# Patient Record
Sex: Female | Born: 1960 | Race: Black or African American | Hispanic: No | Marital: Married | Smoking: Never smoker
Health system: Southern US, Community
[De-identification: ages and names within clinical notes are randomized; demographics above are authoritative.]

---

## 1998-02-04 ENCOUNTER — Ambulatory Visit (HOSPITAL_COMMUNITY): Admission: RE | Admit: 1998-02-04 | Discharge: 1998-02-04 | Payer: Self-pay | Admitting: Obstetrics and Gynecology

## 1998-07-13 ENCOUNTER — Inpatient Hospital Stay (HOSPITAL_COMMUNITY): Admission: AD | Admit: 1998-07-13 | Discharge: 1998-07-13 | Payer: Self-pay | Admitting: *Deleted

## 1998-08-24 ENCOUNTER — Encounter: Admission: RE | Admit: 1998-08-24 | Discharge: 1998-11-22 | Payer: Self-pay | Admitting: Obstetrics and Gynecology

## 1998-10-24 ENCOUNTER — Inpatient Hospital Stay (HOSPITAL_COMMUNITY): Admission: AD | Admit: 1998-10-24 | Discharge: 1998-10-26 | Payer: Self-pay | Admitting: Urology

## 1999-04-06 ENCOUNTER — Other Ambulatory Visit: Admission: RE | Admit: 1999-04-06 | Discharge: 1999-04-06 | Payer: Self-pay | Admitting: Obstetrics and Gynecology

## 2000-10-07 ENCOUNTER — Other Ambulatory Visit: Admission: RE | Admit: 2000-10-07 | Discharge: 2000-10-07 | Payer: Self-pay | Admitting: Obstetrics and Gynecology

## 2001-05-28 ENCOUNTER — Other Ambulatory Visit: Admission: RE | Admit: 2001-05-28 | Discharge: 2001-05-28 | Payer: Self-pay | Admitting: Obstetrics and Gynecology

## 2001-06-02 ENCOUNTER — Observation Stay (HOSPITAL_COMMUNITY): Admission: AD | Admit: 2001-06-02 | Discharge: 2001-06-02 | Payer: Self-pay

## 2001-07-16 ENCOUNTER — Inpatient Hospital Stay (HOSPITAL_COMMUNITY): Admission: AD | Admit: 2001-07-16 | Discharge: 2001-07-16 | Payer: Self-pay | Admitting: Obstetrics and Gynecology

## 2001-10-27 ENCOUNTER — Encounter: Admission: RE | Admit: 2001-10-27 | Discharge: 2001-10-27 | Payer: Self-pay | Admitting: Obstetrics and Gynecology

## 2001-12-05 ENCOUNTER — Encounter (HOSPITAL_COMMUNITY): Admission: RE | Admit: 2001-12-05 | Discharge: 2001-12-27 | Payer: Self-pay | Admitting: Obstetrics and Gynecology

## 2001-12-27 ENCOUNTER — Inpatient Hospital Stay (HOSPITAL_COMMUNITY): Admission: AD | Admit: 2001-12-27 | Discharge: 2001-12-29 | Payer: Self-pay | Admitting: Obstetrics and Gynecology

## 2001-12-30 ENCOUNTER — Encounter: Admission: RE | Admit: 2001-12-30 | Discharge: 2002-01-29 | Payer: Self-pay | Admitting: Obstetrics and Gynecology

## 2002-01-30 ENCOUNTER — Encounter: Admission: RE | Admit: 2002-01-30 | Discharge: 2002-03-01 | Payer: Self-pay | Admitting: Obstetrics and Gynecology

## 2002-02-02 ENCOUNTER — Other Ambulatory Visit: Admission: RE | Admit: 2002-02-02 | Discharge: 2002-02-02 | Payer: Self-pay | Admitting: Obstetrics and Gynecology

## 2002-02-05 ENCOUNTER — Ambulatory Visit (HOSPITAL_COMMUNITY): Admission: RE | Admit: 2002-02-05 | Discharge: 2002-02-05 | Payer: Self-pay | Admitting: Obstetrics and Gynecology

## 2002-03-02 ENCOUNTER — Encounter: Admission: RE | Admit: 2002-03-02 | Discharge: 2002-04-01 | Payer: Self-pay | Admitting: Obstetrics and Gynecology

## 2003-02-12 ENCOUNTER — Other Ambulatory Visit: Admission: RE | Admit: 2003-02-12 | Discharge: 2003-02-12 | Payer: Self-pay | Admitting: Obstetrics and Gynecology

## 2008-02-12 ENCOUNTER — Encounter: Admission: RE | Admit: 2008-02-12 | Discharge: 2008-02-12 | Payer: Self-pay | Admitting: Family Medicine

## 2008-02-12 ENCOUNTER — Encounter (INDEPENDENT_AMBULATORY_CARE_PROVIDER_SITE_OTHER): Payer: Self-pay | Admitting: Diagnostic Radiology

## 2008-02-26 ENCOUNTER — Encounter: Payer: Self-pay | Admitting: Endocrinology

## 2010-04-05 ENCOUNTER — Encounter: Admission: RE | Admit: 2010-04-05 | Discharge: 2010-04-05 | Payer: Self-pay | Admitting: Family Medicine

## 2010-04-26 ENCOUNTER — Encounter: Admission: RE | Admit: 2010-04-26 | Discharge: 2010-04-26 | Payer: Self-pay | Admitting: Family Medicine

## 2010-04-26 ENCOUNTER — Other Ambulatory Visit: Admission: RE | Admit: 2010-04-26 | Discharge: 2010-04-26 | Payer: Self-pay | Admitting: Interventional Radiology

## 2011-01-21 ENCOUNTER — Encounter: Payer: Self-pay | Admitting: Obstetrics and Gynecology

## 2011-01-21 ENCOUNTER — Encounter: Payer: Self-pay | Admitting: Family Medicine

## 2011-05-18 NOTE — H&P (Signed)
[facility]  Patient:    Taylor Villegas, Taylor Villegas Visit Number: [id number] MRN: [id number]          Service Type: OBS Location: [id number] Attending Physician:  Lenoard Aden Dictated by:   Lenoard Aden, M.D. Admit Date:  12/27/2001                           History and Physical  HISTORY OF PRESENT ILLNESS:   The patient is a 50 year old African-American female, G3, P2, EDD January 05, 2001, with class A2 diabetes mellitus and early labor.  ALLERGIES:                    No known drug allergies.  MEDICATIONS:                  Humulin, NPH insulin, and prenatal vitamins.  PAST OBSTETRIC HISTORY:       A 7 pound 4 ounce female in 1994, 7 pound 11 ounce female in 1999, history of preterm labor with both pregnancies.  PAST MEDICAL HISTORY/PAST SURGICAL HISTORY:             No other medical or surgical hospitalization.  FAMILY HISTORY:               Hypertension and diabetes.  PRENATAL LABORATORY DATA:     Pending.  PHYSICAL EXAMINATION:  GENERAL:                      She is a well-developed, well-nourished, African-American female in no apparent distress.  HEENT:                        Normal.  LUNGS:                        Clear.  HEART:                        Regular rate and rhythm.  ABDOMEN:                      Soft, gravid, nontender. Estimated fetal weight on ultrasound is 7 pounds 2 ounces.  PELVIC:                       Cervix 4 cm, 50%, vertex, -2.  EXTREMITIES:                  No cords.  NEUROLOGICAL:                 Nonfocal.  IMPRESSION:                   1. A 39-week intrauterine pregnancy.                               2. Class A2 diabetes mellitus.  PLAN:                         Proceed with augmentation and anticipated attempts at vaginal delivery. Watch blood glucoses every hour. Dictated by:   Lenoard Aden, M.D. Attending Physician:  Lenoard Aden DD:  12/27/01 TD:  12/28/01 Job:  [id number]

## 2011-05-18 NOTE — Op Note (Signed)
[facility]  Patient:    Taylor Villegas, Taylor Villegas Visit Number: [id number] MRN: [id number]          Service Type: DSU Location: [facility] Attending Physician:  Maxie Better Dictated by:   Sheria Lang. Cherly Hensen, M.D. Proc. Date: 02/05/02 Admit Date:  02/05/2002                             Operative Report  PREOPERATIVE DIAGNOSIS:       Desires sterilization.  POSTOPERATIVE DIAGNOSIS:      Desires sterilization and pelvic adhesions.  OPERATION:                    Examination under anesthesia, laparoscopic tubal ligation with bipolar cautery, lysis of adhesions.  SURGEON:                      Sheronette A. Cherly Hensen, M.D.  ANESTHESIA:                   General endotracheal anesthesia.  INDICATIONS:                  This is a 50 year old, gravida 3, para 3, divorced black female, status post vaginal delivery about five weeks ago who now presents for permanent sterilization.  Risks and benefits of the procedure as well as alternative birth control methods have been reviewed with the patient.  Failure rate of 1/500 to 1/600 has been quoted.  Permanence and nonreversibility of the procedure have been discussed.  Consent was signed and the patient was transferred to the operating room.  DESCRIPTION OF PROCEDURE:     Under adequate general anesthesia, the patient was placed in the dorsal lithotomy position.  She was sterilely prepped and draped in the usual fashion.  The bladder was catheterized for a moderate amount of urine.  Examination under anesthesia revealed an anteverted uterus. No adnexal masses could be appreciated.  Bivalve speculum was placed in the vagina. Single-tooth tenaculum was placed in the anterior lip of the cervix. A Kahn cannula was introduced into the cervical os and a tension tenaculum for manipulation of the uterus.  Attention was then turned to the abdomen where 3 cc of 0.25% Marcaine was injected infraumbilically and  suprapubically.  An infraumbilical incision was then made.  Veress needle was introduced.  Opening pressure _____ was noted and 3.5 liters of CO2 were insufflated.  Veress needle was removed.  A 10 mm trocar with sleeve was introduced into this abdominal cavity without incident.  A 5 mm port was introduced after suprapubic incision was made and this was done under direct visualization.  Inspection of the abdomen and pelvis was notable for normal liver edge, normal tubes and ovaries bilaterally.  An adhesion of the omentum to a subserosal posterior fibroid was noted fundally. No evidence of endometriosis in the anterior or posterior cul-de-sac.  Through the suprapubic port, bipolar cautery was then placed.  The fallopian tubes were cauterized in the mid portion.  The cauterization was at least about 1.5 cm of the tube bilaterally.  The adhesions was then also cauterized and was subsequently cut with scissors.  The base of both was inspected and the serosal surface from which the adhesions was present was cauterized.  Appendix appeared normal.  The abdomen was deflated after the _____ was removed.  The infraumbilical port was removed under direct visualization.  The fascia of the umbilical area  was closed with 0 Vicryl figure-of-eight suture and the skin was approximated using 4-0 Vicryl subcuticular stitch.  During the procedure, the tenaculum and acorn apparatus had been displaced and this was again reinserted sterilely.  This resulted in a small laceration on the anterior lip of the cervix and 3-0 chromic figure-of-eight sutures were placed for hemostasis.  The instruments from the vagina were then subsequently removed. Specimen none.  Complication was cervical laceration.  Blood loss was minimal. The patient tolerated the procedure well and was transferred to the recovery room in stable condition.  DESCRIPTION OF PROCEDURE: Dictated by:   Sheronette A. Cherly Hensen, M.D. Attending Physician:   Maxie Better DD:  02/05/02 TD:  02/06/02 Job: [id number]

## 2013-02-20 ENCOUNTER — Encounter (INDEPENDENT_AMBULATORY_CARE_PROVIDER_SITE_OTHER): Payer: BC Managed Care – PPO | Admitting: Radiology

## 2014-05-03 ENCOUNTER — Encounter: Payer: Self-pay | Admitting: Nurse Practitioner

## 2014-05-03 NOTE — Progress Notes (Signed)
This encounter was created in error - please disregard.

## 2014-08-26 ENCOUNTER — Encounter: Payer: Self-pay | Admitting: Adult Health

## 2014-08-26 NOTE — Progress Notes (Signed)
This encounter was created in error - please disregard.

## 2016-03-06 ENCOUNTER — Ambulatory Visit (INDEPENDENT_AMBULATORY_CARE_PROVIDER_SITE_OTHER): Payer: BLUE CROSS/BLUE SHIELD | Admitting: Family Medicine

## 2016-03-06 VITALS — BP 132/80 | HR 110 | Temp 98.4°F | Resp 16 | Ht 65.0 in | Wt 209.0 lb

## 2016-03-06 DIAGNOSIS — R509 Fever, unspecified: Secondary | ICD-10-CM | POA: Diagnosis not present

## 2016-03-06 DIAGNOSIS — J3489 Other specified disorders of nose and nasal sinuses: Secondary | ICD-10-CM | POA: Diagnosis not present

## 2016-03-06 LAB — POCT INFLUENZA A/B
Influenza A, POC: NEGATIVE
Influenza B, POC: NEGATIVE

## 2016-03-06 MED ORDER — AZITHROMYCIN 250 MG PO TABS: ORAL_TABLET | ORAL | Status: DC

## 2016-03-06 NOTE — Patient Instructions (Signed)
Upper Respiratory Infection, Adult Most upper respiratory infections (URIs) are a viral infection of the air passages leading to the lungs. A URI affects the nose, throat, and upper air passages. The most common type of URI is nasopharyngitis and is typically referred to as "the common cold." URIs run their course and usually go away on their own. Most of the time, a URI does not require medical attention, but sometimes a bacterial infection in the upper airways can follow a viral infection. This is called a secondary infection. Sinus and middle ear infections are common types of secondary upper respiratory infections. Bacterial pneumonia can also complicate a URI. A URI can worsen asthma and chronic obstructive pulmonary disease (COPD). Sometimes, these complications can require emergency medical care and may be life threatening.  CAUSES Almost all URIs are caused by viruses. A virus is a type of germ and can spread from one person to another.  RISKS FACTORS You may be at risk for a URI if:   You smoke.   You have chronic heart or lung disease.  You have a weakened defense (immune) system.   You are very young or very old.   You have nasal allergies or asthma.  You work in crowded or poorly ventilated areas.  You work in health care facilities or schools. SIGNS AND SYMPTOMS  Symptoms typically develop 2-3 days after you come in contact with a cold virus. Most viral URIs last 7-10 days. However, viral URIs from the influenza virus (flu virus) can last 14-18 days and are typically more severe. Symptoms may include:   Runny or stuffy (congested) nose.   Sneezing.   Cough.   Sore throat.   Headache.   Fatigue.   Fever.   Loss of appetite.   Pain in your forehead, behind your eyes, and over your cheekbones (sinus pain).  Muscle aches.  DIAGNOSIS  Your health care provider may diagnose a URI by:  Physical exam.  Tests to check that your symptoms are not due to  another condition such as:  Strep throat.  Sinusitis.  Pneumonia.  Asthma. TREATMENT  A URI goes away on its own with time. It cannot be cured with medicines, but medicines may be prescribed or recommended to relieve symptoms. Medicines may help:  Reduce your fever.  Reduce your cough.  Relieve nasal congestion. HOME CARE INSTRUCTIONS   Take medicines only as directed by your health care provider.   Gargle warm saltwater or take cough drops to comfort your throat as directed by your health care provider.  Use a warm mist humidifier or inhale steam from a shower to increase air moisture. This may make it easier to breathe.  Drink enough fluid to keep your urine clear or pale yellow.   Eat soups and other clear broths and maintain good nutrition.   Rest as needed.   Return to work when your temperature has returned to normal or as your health care provider advises. You may need to stay home longer to avoid infecting others. You can also use a face mask and careful hand washing to prevent spread of the virus.  Increase the usage of your inhaler if you have asthma.   Do not use any tobacco products, including cigarettes, chewing tobacco, or electronic cigarettes. If you need help quitting, ask your health care provider. PREVENTION  The best way to protect yourself from getting a cold is to practice good hygiene.   Avoid oral or hand contact with people with cold   symptoms.   Wash your hands often if contact occurs.  There is no clear evidence that vitamin C, vitamin E, echinacea, or exercise reduces the chance of developing a cold. However, it is always recommended to get plenty of rest, exercise, and practice good nutrition.  SEEK MEDICAL CARE IF:   You are getting worse rather than better.   Your symptoms are not controlled by medicine.   You have chills.  You have worsening shortness of breath.  You have brown or red mucus.  You have yellow or brown nasal  discharge.  You have pain in your face, especially when you bend forward.  You have a fever.  You have swollen neck glands.  You have pain while swallowing.  You have white areas in the back of your throat. SEEK IMMEDIATE MEDICAL CARE IF:   You have severe or persistent:  Headache.  Ear pain.  Sinus pain.  Chest pain.  You have chronic lung disease and any of the following:  Wheezing.  Prolonged cough.  Coughing up blood.  A change in your usual mucus.  You have a stiff neck.  You have changes in your:  Vision.  Hearing.  Thinking.  Mood. MAKE SURE YOU:   Understand these instructions.  Will watch your condition.  Will get help right away if you are not doing well or get worse.   This information is not intended to replace advice given to you by your health care provider. Make sure you discuss any questions you have with your health care provider.   Document Released: 06/12/2001 Document Revised: 05/03/2015 Document Reviewed: 03/24/2014 Elsevier Interactive Patient Education 2016 Elsevier Inc.  

## 2016-03-06 NOTE — Progress Notes (Signed)
Patient ID: Taylor Villegas, female   DOB: May 07, 1961, 55 y.o.   MRN: [id number]  By signing my name below, I, Essence Howell, attest that this documentation has been prepared under the direction and in the presence of Elvina Sidle, MD Electronically Signed: Charline Bills, ED Scribe 03/06/2016 at 11:39 AM.  Patient ID: Taylor Villegas MRN: [id number], DOB: December 30, 1961, 55 y.o. Date of Encounter: 03/06/2016, 11:15 AM  Primary Physician: No primary care provider on file.  Chief Complaint:  Chief Complaint  Patient presents with  . Chills    x 2 days  . Generalized Body Aches  . Nasal Congestion  . Cough  . Sore Throat  . cant keep food down  . OTHER    paperwork need to fill out    HPI: 55 y.o. year old female with history below presents with persistent postnasal drip for the past 3 days. Pt reports associated symptoms of chills, night sweats, sore throat, productive cough with sputum, generalized body aches, vomiting, HA. No treatments tried PTA. She denies fever, diarrhea. Pt reports sick contacts with similar symptoms.   Pt works at [employer].   History reviewed. No pertinent past medical history.   Home Meds: Prior to Admission medications   Not on File    Allergies: No Known Allergies  Social History   Social History  . Marital Status: Married    Spouse Name: N/A  . Number of Children: N/A  . Years of Education: N/A   Occupational History  . Not on file.   Social History Main Topics  . Smoking status: Never Smoker   . Smokeless tobacco: Never Used  . Alcohol Use: No  . Drug Use: No  . Sexual Activity: Not on file   Other Topics Concern  . Not on file   Social History Narrative    Review of Systems: Constitutional: negative for fever,  weight changes, or fatigue, +chills, +night sweats HEENT: negative for vision changes, hearing loss, congestion, epistaxis, or sinus pressure, +rhinorrhea, +ST, +postnasal drip Cardiovascular: negative for  chest pain or palpitations Respiratory: negative for hemoptysis, wheezing, shortness of breath, +cough Abdominal: negative for abdominal pain, nausea, -diarrhea, or constipation, +vomiting Msk: +myalgias Dermatological: negative for rash Neurologic: negative for dizziness, or syncope, +headache All other systems reviewed and are otherwise negative with the exception to those above and in the HPI.  Physical Exam: Blood pressure 132/80, pulse 110, temperature 98.4 F (36.9 C), temperature source Oral, resp. rate 16, height  (1.651 m), weight 209 lb (94.802 kg), SpO2 97 %., Body mass index is 34.78 kg/(m^2). General: Well developed, well nourished, in no acute distress. Head: Normocephalic, atraumatic, eyes without discharge, sclera non-icteric. Watery nasal discharge. Watery postnasal drip. Bilateral auditory canals clear, TM's are without perforation, pearly grey and translucent with reflective cone of light bilaterally. Oral cavity moist, posterior pharynx without exudate, erythema, peritonsillar abscess.  Neck: Supple. No thyromegaly. Full ROM. No lymphadenopathy. Lungs: Clear bilaterally to auscultation without wheezes, rales, or rhonchi. Breathing is unlabored. Heart: RRR with S1 S2. No murmurs, rubs, or gallops appreciated. Abdomen: Soft, non-tender, non-distended with normoactive bowel sounds. No hepatomegaly. No rebound/guarding. No obvious abdominal masses. Msk:  Strength and tone normal for age. Extremities/Skin: Warm and dry. No clubbing or cyanosis. No edema. No rashes or suspicious lesions. Neuro: Alert and oriented X 3. Moves all extremities spontaneously. Gait is normal. CNII-XII grossly in tact. Psych:  Responds to questions appropriately with a normal affect.   Labs: Results for orders  placed or performed in visit on 03/06/16  POCT Influenza A/B  Result Value Ref Range   Influenza A, POC Negative Negative   Influenza B, POC Negative Negative    ASSESSMENT AND PLAN:    55 y.o. year old female with  1. Nasal discharge   2. Fever, unspecified fever cause    This chart was scribed in my presence and reviewed by me personally.    ICD-9-CM ICD-10-CM   1. Nasal discharge 478.19 J34.89 POCT Influenza A/B     azithromycin (ZITHROMAX) 250 MG tablet  2. Fever, unspecified fever cause 780.60 R50.9 POCT Influenza A/B     azithromycin (ZITHROMAX) 250 MG tablet     Signed, Elvina SidleKurt Peytan Andringa, MD 03/06/2016 11:15 AM

## 2016-03-20 ENCOUNTER — Other Ambulatory Visit: Payer: Self-pay | Admitting: Family Medicine

## 2016-03-20 ENCOUNTER — Telehealth: Payer: Self-pay

## 2016-03-20 MED ORDER — AMOXICILLIN-POT CLAVULANATE 875-125 MG PO TABS: 1.0000 | ORAL_TABLET | Freq: Two times a day (BID) | ORAL | Status: DC

## 2016-03-20 NOTE — Telephone Encounter (Signed)
Pt was seen in office two weeks ago and is now complaining that the antibiotic she was prescribed did not work. She states that she's been trying to call our office all last week and no one answered. I explained to patient that she would have to come back into the office to be seen to be prescribed another antibiotic. Pt is very upset and said she can't come into the office. Please give patient a call.

## 2016-03-21 NOTE — Progress Notes (Signed)
Pt advised Rx sent in 

## 2016-06-11 ENCOUNTER — Ambulatory Visit (INDEPENDENT_AMBULATORY_CARE_PROVIDER_SITE_OTHER): Payer: BLUE CROSS/BLUE SHIELD | Admitting: Family Medicine

## 2016-06-11 VITALS — BP 130/86 | HR 97 | Temp 98.0°F | Resp 18 | Ht 65.0 in | Wt 209.0 lb

## 2016-06-11 DIAGNOSIS — R635 Abnormal weight gain: Secondary | ICD-10-CM

## 2016-06-11 DIAGNOSIS — F4323 Adjustment disorder with mixed anxiety and depressed mood: Secondary | ICD-10-CM | POA: Diagnosis not present

## 2016-06-11 DIAGNOSIS — Z658 Other specified problems related to psychosocial circumstances: Secondary | ICD-10-CM | POA: Diagnosis not present

## 2016-06-11 DIAGNOSIS — R400 Somnolence: Secondary | ICD-10-CM

## 2016-06-11 DIAGNOSIS — R519 Headache, unspecified: Secondary | ICD-10-CM

## 2016-06-11 DIAGNOSIS — E041 Nontoxic single thyroid nodule: Secondary | ICD-10-CM | POA: Diagnosis not present

## 2016-06-11 DIAGNOSIS — F439 Reaction to severe stress, unspecified: Secondary | ICD-10-CM

## 2016-06-11 DIAGNOSIS — N951 Menopausal and female climacteric states: Secondary | ICD-10-CM | POA: Diagnosis not present

## 2016-06-11 DIAGNOSIS — R51 Headache: Secondary | ICD-10-CM

## 2016-06-11 DIAGNOSIS — R0683 Snoring: Secondary | ICD-10-CM | POA: Diagnosis not present

## 2016-06-11 DIAGNOSIS — G4710 Hypersomnia, unspecified: Secondary | ICD-10-CM | POA: Diagnosis not present

## 2016-06-11 NOTE — Patient Instructions (Signed)
IF you received an x-ray today, you will receive an invoice from [facility]. Please contact [facility] at [phone] with questions or concerns regarding your invoice.   IF you received labwork today, you will receive an invoice from [employer]. Please contact [facility] at [phone] with questions or concerns regarding your invoice.   Our billing staff will not be able to assist you with questions regarding bills from these companies.  You will be contacted with the lab results as soon as they are available. The fastest way to get your results is to activate your My Chart account. Instructions are located on the last page of this paperwork. If you have not heard from [place] regarding the results in 2 weeks, please contact this office.     Headaches may be related to tension, stress, decreased quality sleep, or a combination of all. They may be migraine headaches, and can have you evaluated by a neurologist or headache specialist whenever he would like. In the meantime, Tylenol as needed, and keep a diary of when you are having a headache and any associated symptoms.  Your thyroid does appear to be enlarged with a possible nodule on the right. I will schedule an ultrasound of your thyroid as well as check thyroid test. Once we have the results, can discuss further workup if needed.  Your flushes are likely due to menopause, but as we discussed, thyroid can also be a cause.  I will check some blood tests for this as well, and can follow-up to discuss other treatments in the next few weeks. See handout for treatment options over the counter. I would also encourage you to schedule appointment with Dr. Garwin Brothers to discuss other options for menopausal symptoms.  See information on stress and stress management below. If you are feeling more depression symptoms, or would like to discuss medication options, return to discuss further here or other medical  provider. Return to the clinic or go to the nearest emergency room if any of your symptoms worsen or new symptoms occur.  I will refer you for a sleep study.   For weight, no new medications recommended for now until we have more information on above. If you would like to meet with a weight management specialist, Dr. Valetta Close at [place] may be able to advise you further: [phone]  Return to the clinic or go to the nearest emergency room if any of your symptoms worsen or new symptoms occur.  Stress and Stress Management Stress is a normal reaction to life events. It is what you feel when life demands more than you are used to or more than you can handle. Some stress can be useful. For example, the stress reaction can help you catch the last bus of the day, study for a test, or meet a deadline at work. But stress that occurs too often or for too long can cause problems. It can affect your emotional health and interfere with relationships and normal daily activities. Too much stress can weaken your immune system and increase your risk for physical illness. If you already have a medical problem, stress can make it worse. CAUSES  All sorts of life events may cause stress. An event that causes stress for one person may not be stressful for another person. Major life events commonly cause stress. These may be positive or negative. Examples include losing your job, moving into a new home, getting married, having a baby, or losing  a loved one. Less obvious life events may also cause stress, especially if they occur day after day or in combination. Examples include working long hours, driving in traffic, caring for children, being in debt, or being in a difficult relationship. SIGNS AND SYMPTOMS Stress may cause emotional symptoms including, the following:  Anxiety. This is feeling worried, afraid, on edge, overwhelmed, or out of control.  Anger. This is feeling irritated or  impatient.  Depression. This is feeling sad, down, helpless, or guilty.  Difficulty focusing, remembering, or making decisions. Stress may cause physical symptoms, including the following:   Aches and pains. These may affect your head, neck, back, stomach, or other areas of your body.  Tight muscles or clenched jaw.  Low energy or trouble sleeping. Stress may cause unhealthy behaviors, including the following:   Eating to feel better (overeating) or skipping meals.  Sleeping too little, too much, or both.  Working too much or putting off tasks (procrastination).  Smoking, drinking alcohol, or using drugs to feel better. DIAGNOSIS  Stress is diagnosed through an assessment by your health care provider. Your health care provider will ask questions about your symptoms and any stressful life events.Your health care provider will also ask about your medical history and may order blood tests or other tests. Certain medical conditions and medicine can cause physical symptoms similar to stress. Mental illness can cause emotional symptoms and unhealthy behaviors similar to stress. Your health care provider may refer you to a mental health professional for further evaluation.  TREATMENT  Stress management is the recommended treatment for stress.The goals of stress management are reducing stressful life events and coping with stress in healthy ways.  Techniques for reducing stressful life events include the following:  Stress identification. Self-monitor for stress and identify what causes stress for you. These skills may help you to avoid some stressful events.  Time management. Set your priorities, keep a calendar of events, and learn to say "no." These tools can help you avoid making too many commitments. Techniques for coping with stress include the following:  Rethinking the problem. Try to think realistically about stressful events rather than ignoring them or overreacting. Try to find  the positives in a stressful situation rather than focusing on the negatives.  Exercise. Physical exercise can release both physical and emotional tension. The key is to find a form of exercise you enjoy and do it regularly.  Relaxation techniques. These relax the body and mind. Examples include yoga, meditation, tai chi, biofeedback, deep breathing, progressive muscle relaxation, listening to music, being out in nature, journaling, and other hobbies. Again, the key is to find one or more that you enjoy and can do regularly.  Healthy lifestyle. Eat a balanced diet, get plenty of sleep, and do not smoke. Avoid using alcohol or drugs to relax.  Strong support network. Spend time with family, friends, or other people you enjoy being around.Express your feelings and talk things over with someone you trust. Counseling or talktherapy with a mental health professional may be helpful if you are having difficulty managing stress on your own. Medicine is typically not recommended for the treatment of stress.Talk to your health care provider if you think you need medicine for symptoms of stress. HOME CARE INSTRUCTIONS  Keep all follow-up visits as directed by your health care provider.  Take all medicines as directed by your health care provider. SEEK MEDICAL CARE IF:  Your symptoms get worse or you start having new symptoms.  You feel  overwhelmed by your problems and can no longer manage them on your own. SEEK IMMEDIATE MEDICAL CARE IF:  You feel like hurting yourself or someone else.   This information is not intended to replace advice given to you by your health care provider. Make sure you discuss any questions you have with your health care provider.   Document Released: 06/12/2001 Document Revised: 01/07/2015 Document Reviewed: 08/11/2013 Elsevier Interactive Patient Education [employer].

## 2016-06-11 NOTE — Progress Notes (Signed)
Subjective:  By signing my name below, I, Raven Small, attest that this documentation has been prepared under the direction and in the presence of Merri Ray, MD.  Electronically Signed: Thea Alken, ED Scribe. 06/11/2016. 5:12 PM.   Patient ID: Taylor Villegas, female    DOB: 10/15/1961, 55 y.o.   MRN: [id number]  HPI Chief Complaint  Patient presents with  . Migraine  . Night Sweats    ALL DAY  . Obesity    HPI Comments: Taylor Villegas is a 55 y.o. female who presents to the [facility] complaining of migraines, weight gain and night sweats. She is new pt to me, last seen by Dr. Joseph Art in March for a acute illness. LMP-1/1. Prior to this,  menses was irregular coming every other month or 2. Pt reports since menses ended she's gained a lot of weight, which has made her self consciences. She states she's gained 20lbs since September . Pt tries to work out 30 mins - hour a week. She does not indulge in fast foods, sugar containing beverages or desserts. Gaining weight has made her feel anxious. She has been treated for anxiety and depression several years ago. She has tried therapy in the past but has felt ths was a waste of time. She has tried a Physiological scientist, nutritionist, diets, B12 injection and appetite suppressants in the past as well but this did not work either. In addition to this, she reports hot flashes, night sweats 3-4 times a day as well as at night and fatigue, falling asleep during the day, since onset of weight gain in September. Also as a result of weight gain she reports snoring, which she has never done. Pt reports several years ago she was diagnosed with hyperthyroidism, but was not started on medication. Pt has not had this tested since. Pt shows interest in hormone replacement.   Wt Readings from Last 3 Encounters:  06/11/16 209 lb (94.802 kg)  03/06/16 209 lb (94.802 kg)  08/26/14 204 lb (92.534 kg)   She also complains of  episodic migraines for the past 4 weeks with associated nausea. Last headache she had lasted for 3 days. Pt reports family stress, stating that her father is in his last stage of parkinsons. Pt's youngest brother is the caregiver of their father and feels most comfortable talking to him, but states they talk very little of stress and emotion. Pt also states her husband wants a divorce. Her son is currently undergoing counseling for this as well as marijuana abuse. States her son was terminated from work due to falling asleep.   There are no active problems to display for this patient.  History reviewed. No pertinent past medical history. History reviewed. No pertinent past surgical history. No Known Allergies Prior to Admission medications   Medication Sig Start Date End Date Taking? Authorizing Provider  amoxicillin-clavulanate (AUGMENTIN) 875-125 MG tablet Take 1 tablet by mouth 2 (two) times daily. Patient not taking: Reported on 06/11/2016 03/20/16   Robyn Haber, MD  azithromycin (ZITHROMAX) 250 MG tablet Take 2 tabs PO x 1 dose, then 1 tab PO QD x 4 days Patient not taking: Reported on 06/11/2016 03/06/16   Robyn Haber, MD   Social History   Social History  . Marital Status: Married    Spouse Name: N/A  . Number of Children: N/A  . Years of Education: N/A   Occupational History  . Not on file.   Social History Main Topics  .  Smoking status: Never Smoker   . Smokeless tobacco: Never Used  . Alcohol Use: No  . Drug Use: No  . Sexual Activity: Not on file   Other Topics Concern  . Not on file   Social History Narrative   Review of Systems  Constitutional: Positive for fatigue and unexpected weight change.  Gastrointestinal: Positive for nausea. Negative for vomiting.  Neurological: Positive for headaches.  Psychiatric/Behavioral: Negative for suicidal ideas and self-injury. The patient is nervous/anxious.    Objective:   Physical Exam  Constitutional: She is oriented  to person, place, and time. She appears well-developed and well-nourished. No distress.  HENT:  Head: Normocephalic and atraumatic.  Eyes: Conjunctivae and EOM are normal.  Neck: Neck supple.  Prominent thyroid with slight nodularity on the right side, but is non tender.   Cardiovascular: Normal rate, regular rhythm and normal heart sounds.  Exam reveals no gallop and no friction rub.   No murmur heard. Pulmonary/Chest: Effort normal.  Musculoskeletal: Normal range of motion.  Neurological: She is alert and oriented to person, place, and time.  Skin: Skin is warm and dry.  Psychiatric: She has a normal mood and affect. Her behavior is normal.  Nursing note and vitals reviewed.  Filed Vitals:   06/11/16 1627  BP: 130/86  Pulse: 97  Temp: 98 F (36.7 C)  TempSrc: Oral  Resp: 18  Height: _0  (1.651 m)  Weight: 209 lb (94.802 kg)  SpO2: 99%   Assessment & Plan:  Taylor Villegas is a 55 y.o. female Hot flushes, perimenopausal - Plan: TSH, FSH/LH, CBC, T3, Free, T4, Free, US Soft Tissue Head/Neck  - Suspected perimenopausal. Check TSH, FSH, LH. Handout from up-to-date on treatment options, can also follow-up with her OB/GYN.   Nonintractable headache, unspecified chronicity pattern, unspecified headache type  - Episodic. Multifactorial likely with tension/stress headache. Consider headache wellness Center evaluation if persistent. Tylenol for now.  Adjustment disorder with mixed anxiety and depressed mood Situational stress  - Suspect possible underlying anxiety/depression with situational worsening with her menopausal symptoms, as well as family stressors as above. Declined counseling, and declined medications as this did not help in the past.  Denies suicidal ideation. Stress management techniques discussed on handout.  Weight gain - Plan: TSH, FSH/LH  -minimal change from our readings here. Check labs above, and number given for bariatric specialist. No new meds  recommended for now.   Snoring - Plan: Ambulatory referral to Sleep Studies Daytime somnolence - Plan: Ambulatory referral to Sleep Studies  -refer for sleep study/eval.   Thyroid nodule - Plan: TSH, T3, Free, T4, Free, US Soft Tissue Head/Neck  -hx of hyperthyroid by hx, possible nodule on right side. Check labs, ultrasound. Plan on follow up in 2 weeks.   No orders of the defined types were placed in this encounter.   Patient Instructions       IF you received an x-ray today, you will receive an invoice from [facility]. Please contact [facility] at [phone] with questions or concerns regarding your invoice.   IF you received labwork today, you will receive an invoice from [employer]. Please contact [facility] at [phone] with questions or concerns regarding your invoice.   Our billing staff will not be able to assist you with questions regarding bills from these companies.  You will be contacted with the lab results as soon as they are available. The fastest way to get your results is to activate your My Chart  account. Instructions are located on the last page of this paperwork. If you have not heard from [place] regarding the results in 2 weeks, please contact this office.     Headaches may be related to tension, stress, decreased quality sleep, or a combination of all. They may be migraine headaches, and can have you evaluated by a neurologist or headache specialist whenever he would like. In the meantime, Tylenol as needed, and keep a diary of when you are having a headache and any associated symptoms.  Your thyroid does appear to be enlarged with a possible nodule on the right. I will schedule an ultrasound of your thyroid as well as check thyroid test. Once we have the results, can discuss further workup if needed.  Your flushes are likely due to menopause, but as we discussed, thyroid can also be a cause.  I will check some blood  tests for this as well, and can follow-up to discuss other treatments in the next few weeks. See handout for treatment options over the counter. I would also encourage you to schedule appointment with Dr. Garwin Brothers to discuss other options for menopausal symptoms.  See information on stress and stress management below. If you are feeling more depression symptoms, or would like to discuss medication options, return to discuss further here or other medical provider. Return to the clinic or go to the nearest emergency room if any of your symptoms worsen or new symptoms occur.  I will refer you for a sleep study.   For weight, no new medications recommended for now until we have more information on above. If you would like to meet with a weight management specialist, Dr. Valetta Close at [facility] may be able to advise you further: [phone]  Return to the clinic or go to the nearest emergency room if any of your symptoms worsen or new symptoms occur.  Stress and Stress Management Stress is a normal reaction to life events. It is what you feel when life demands more than you are used to or more than you can handle. Some stress can be useful. For example, the stress reaction can help you catch the last bus of the day, study for a test, or meet a deadline at work. But stress that occurs too often or for too long can cause problems. It can affect your emotional health and interfere with relationships and normal daily activities. Too much stress can weaken your immune system and increase your risk for physical illness. If you already have a medical problem, stress can make it worse. CAUSES  All sorts of life events may cause stress. An event that causes stress for one person may not be stressful for another person. Major life events commonly cause stress. These may be positive or negative. Examples include losing your job, moving into a new home, getting married, having a baby, or losing a loved  one. Less obvious life events may also cause stress, especially if they occur day after day or in combination. Examples include working long hours, driving in traffic, caring for children, being in debt, or being in a difficult relationship. SIGNS AND SYMPTOMS Stress may cause emotional symptoms including, the following:  Anxiety. This is feeling worried, afraid, on edge, overwhelmed, or out of control.  Anger. This is feeling irritated or impatient.  Depression. This is feeling sad, down, helpless, or guilty.  Difficulty focusing, remembering, or making decisions. Stress may cause physical symptoms, including the following:   Aches and pains. These  may affect your head, neck, back, stomach, or other areas of your body.  Tight muscles or clenched jaw.  Low energy or trouble sleeping. Stress may cause unhealthy behaviors, including the following:   Eating to feel better (overeating) or skipping meals.  Sleeping too little, too much, or both.  Working too much or putting off tasks (procrastination).  Smoking, drinking alcohol, or using drugs to feel better. DIAGNOSIS  Stress is diagnosed through an assessment by your health care provider. Your health care provider will ask questions about your symptoms and any stressful life events.Your health care provider will also ask about your medical history and may order blood tests or other tests. Certain medical conditions and medicine can cause physical symptoms similar to stress. Mental illness can cause emotional symptoms and unhealthy behaviors similar to stress. Your health care provider may refer you to a mental health professional for further evaluation.  TREATMENT  Stress management is the recommended treatment for stress.The goals of stress management are reducing stressful life events and coping with stress in healthy ways.  Techniques for reducing stressful life events include the following:  Stress identification. Self-monitor  for stress and identify what causes stress for you. These skills may help you to avoid some stressful events.  Time management. Set your priorities, keep a calendar of events, and learn to say "no." These tools can help you avoid making too many commitments. Techniques for coping with stress include the following:  Rethinking the problem. Try to think realistically about stressful events rather than ignoring them or overreacting. Try to find the positives in a stressful situation rather than focusing on the negatives.  Exercise. Physical exercise can release both physical and emotional tension. The key is to find a form of exercise you enjoy and do it regularly.  Relaxation techniques. These relax the body and mind. Examples include yoga, meditation, tai chi, biofeedback, deep breathing, progressive muscle relaxation, listening to music, being out in nature, journaling, and other hobbies. Again, the key is to find one or more that you enjoy and can do regularly.  Healthy lifestyle. Eat a balanced diet, get plenty of sleep, and do not smoke. Avoid using alcohol or drugs to relax.  Strong support network. Spend time with family, friends, or other people you enjoy being around.Express your feelings and talk things over with someone you trust. Counseling or talktherapy with a mental health professional may be helpful if you are having difficulty managing stress on your own. Medicine is typically not recommended for the treatment of stress.Talk to your health care provider if you think you need medicine for symptoms of stress. HOME CARE INSTRUCTIONS  Keep all follow-up visits as directed by your health care provider.  Take all medicines as directed by your health care provider. SEEK MEDICAL CARE IF:  Your symptoms get worse or you start having new symptoms.  You feel overwhelmed by your problems and can no longer manage them on your own. SEEK IMMEDIATE MEDICAL CARE IF:  You feel like hurting  yourself or someone else.   This information is not intended to replace advice given to you by your health care provider. Make sure you discuss any questions you have with your health care provider.   Document Released: 06/12/2001 Document Revised: 01/07/2015 Document Reviewed: 08/11/2013 Elsevier Interactive Patient Education [employer].

## 2016-06-12 LAB — FSH/LH
FSH: 69.6 m[IU]/mL
LH: 47.8 m[IU]/mL

## 2016-06-12 LAB — T4, FREE: FREE T4: 1.3 ng/dL (ref 0.8–1.8)

## 2016-06-12 LAB — CBC
HEMATOCRIT: 40.4 % (ref 35.0–45.0)
HEMOGLOBIN: 13.6 g/dL (ref 11.7–15.5)
MCH: 26.4 pg — ABNORMAL LOW (ref 27.0–33.0)
MCHC: 33.7 g/dL (ref 32.0–36.0)
MCV: 78.4 fL — ABNORMAL LOW (ref 80.0–100.0)
MPV: 10.1 fL (ref 7.5–12.5)
Platelets: 389 10*3/uL (ref 140–400)
RBC: 5.15 MIL/uL — AB (ref 3.80–5.10)
RDW: 15.7 % — ABNORMAL HIGH (ref 11.0–15.0)
WBC: 8.2 10*3/uL (ref 3.8–10.8)

## 2016-06-12 LAB — TSH: TSH: 0.56 mIU/L

## 2016-06-12 LAB — T3, FREE: T3 FREE: 3.3 pg/mL (ref 2.3–4.2)

## 2016-06-13 ENCOUNTER — Encounter: Payer: Self-pay | Admitting: Family Medicine

## 2016-06-19 ENCOUNTER — Institutional Professional Consult (permissible substitution): Payer: BLUE CROSS/BLUE SHIELD | Admitting: Neurology

## 2016-06-20 ENCOUNTER — Other Ambulatory Visit: Payer: Self-pay

## 2016-06-26 ENCOUNTER — Telehealth: Payer: Self-pay | Admitting: Emergency Medicine

## 2016-06-26 NOTE — Telephone Encounter (Signed)
-----   Message from Taylor FloodJeffrey R Greene, MD sent at 06/24/2016  3:34 PM EDT ----- Call patient. Thyroid testing was normal, FSH/LH hormones are in postmenopausal range. Overall blood counts look okay. Let me know if there are any questions.  Dr. Neva SeatGreene

## 2016-06-26 NOTE — Telephone Encounter (Signed)
Attempted to reach pt with results Both numbers listed VM full and not valid

## 2016-07-02 ENCOUNTER — Other Ambulatory Visit: Payer: Self-pay

## 2016-07-11 ENCOUNTER — Institutional Professional Consult (permissible substitution): Payer: BLUE CROSS/BLUE SHIELD | Admitting: Neurology

## 2016-07-24 ENCOUNTER — Institutional Professional Consult (permissible substitution): Payer: BLUE CROSS/BLUE SHIELD | Admitting: Neurology

## 2020-02-17 ENCOUNTER — Encounter: Payer: Self-pay | Admitting: Adult Health Nurse Practitioner

## 2020-02-17 ENCOUNTER — Ambulatory Visit: Payer: 59 | Admitting: Adult Health Nurse Practitioner

## 2020-02-17 ENCOUNTER — Other Ambulatory Visit: Payer: Self-pay

## 2020-02-17 ENCOUNTER — Other Ambulatory Visit (HOSPITAL_COMMUNITY)
Admission: RE | Admit: 2020-02-17 | Discharge: 2020-02-17 | Disposition: A | Discharge status: Home / Self Care | Payer: 59 | Source: Ambulatory Visit | Attending: Adult Health Nurse Practitioner | Admitting: Adult Health Nurse Practitioner

## 2020-02-17 VITALS — BP 179/83 | HR 99 | Temp 98.2°F | Ht 64.5 in | Wt 215.4 lb

## 2020-02-17 DIAGNOSIS — Z113 Encounter for screening for infections with a predominantly sexual mode of transmission: Secondary | ICD-10-CM | POA: Insufficient documentation

## 2020-02-17 DIAGNOSIS — N3000 Acute cystitis without hematuria: Secondary | ICD-10-CM | POA: Diagnosis not present

## 2020-02-17 DIAGNOSIS — R350 Frequency of micturition: Secondary | ICD-10-CM | POA: Diagnosis not present

## 2020-02-17 DIAGNOSIS — N390 Urinary tract infection, site not specified: Secondary | ICD-10-CM

## 2020-02-17 HISTORY — DX: Urinary tract infection, site not specified: N39.0

## 2020-02-17 LAB — POCT URINALYSIS DIP (MANUAL ENTRY)
Bilirubin, UA: NEGATIVE
Glucose, UA: NEGATIVE mg/dL
Ketones, POC UA: NEGATIVE mg/dL
Nitrite, UA: NEGATIVE
Spec Grav, UA: >=1.03 — AB (ref 1.010–1.025)
Urobilinogen, UA: 0.2 E.U./dL
pH, UA: 6 (ref 5.0–8.0)

## 2020-02-17 MED ORDER — NITROFURANTOIN MONOHYD MACRO 100 MG PO CAPS: 100.0000 mg | ORAL_CAPSULE | Freq: Two times a day (BID) | ORAL | 0 refills | Status: AC

## 2020-02-17 NOTE — Patient Instructions (Signed)
° ° ° °  If you have lab work done today you will be contacted with your lab results within the next 2 weeks.  If you have not heard from us then please contact us. The fastest way to get your results is to register for My Chart. ° ° °IF you received an x-ray today, you will receive an invoice from [facility]. Please contact [facility] at [phone] with questions or concerns regarding your invoice.  ° °IF you received labwork today, you will receive an invoice from [facility]. Please contact [facility] at [phone] with questions or concerns regarding your invoice.  ° °Our billing staff will not be able to assist you with questions regarding bills from these companies. ° °You will be contacted with the lab results as soon as they are available. The fastest way to get your results is to activate your My Chart account. Instructions are located on the last page of this paperwork. If you have not heard from us regarding the results in 2 weeks, please contact this office. °  ° ° ° °

## 2020-02-18 LAB — GC/CHLAMYDIA PROBE AMP (~~LOC~~) NOT AT ARMC
Chlamydia: NEGATIVE
Comment: NEGATIVE
Comment: NORMAL
Neisseria Gonorrhea: NEGATIVE

## 2020-02-18 LAB — RPR: RPR Ser Ql: NONREACTIVE

## 2020-02-18 LAB — HIV ANTIBODY (ROUTINE TESTING W REFLEX): HIV Screen 4th Generation wRfx: NONREACTIVE

## 2020-02-19 LAB — URINE CULTURE

## 2020-02-23 ENCOUNTER — Encounter: Payer: Self-pay | Admitting: Adult Health Nurse Practitioner

## 2020-02-23 DIAGNOSIS — Z113 Encounter for screening for infections with a predominantly sexual mode of transmission: Secondary | ICD-10-CM | POA: Insufficient documentation

## 2020-02-23 DIAGNOSIS — R350 Frequency of micturition: Secondary | ICD-10-CM | POA: Insufficient documentation

## 2020-02-23 NOTE — Progress Notes (Signed)
       02/23/2020  Taylor Villegas Jun 27, 1961 [id number]  SUBJECTIVE: Taylor Villegas is a 60 y.o. female who complains of urinary frequency, urgency and dysuria x  4 days, without flank pain, fever, chills, or abnormal vaginal discharge or bleeding. In addition, she would like STI testing.  Currently no symptoms of vaginal discharge, discomfort or bleeding.   OBJECTIVE: Appears well, in no apparent distress.  Vital signs are normal. The abdomen is soft without tenderness, guarding, mass, rebound or organomegaly. No CVA tenderness or inguinal adenopathy noted. Urine dipstick shows positive for protein and positive for leukocytes.  Micro exam: not done.   ASSESSMENT: presumed UTI  1. Frequent urination   2. Routine screening for STI (sexually transmitted infection)   3. Acute cystitis without hematuria      PLAN: Treatment per orders - also push fluids, may use Pyridium OTC prn. Call or return to clinic prn if these symptoms worsen or fail to improve as anticipated.  Meds ordered this encounter  Medications  . nitrofurantoin, macrocrystal-monohydrate, (MACROBID) 100 MG capsule    Sig: Take 1 capsule (100 mg total) by mouth 2 (two) times daily for 3 days.    Dispense:  6 capsule    Refill:  0    Orders Placed This Encounter  Procedures  . Urine Culture  . HIV Antibody (routine testing w rflx)  . RPR  . POCT urinalysis dipstick    Will cover for presumed minimal UTI or symptomatic urinary tract irritation.  Will send labs and samples for routine STI screening as desired.  She is inline with this plan.  Will f/u if cx shows abnormal results.   Elyse Jarvis, NP

## 2020-03-08 ENCOUNTER — Ambulatory Visit: Payer: 59 | Admitting: Adult Health Nurse Practitioner

## 2020-03-11 ENCOUNTER — Other Ambulatory Visit: Payer: Self-pay

## 2020-03-11 ENCOUNTER — Other Ambulatory Visit (HOSPITAL_COMMUNITY)
Admission: RE | Admit: 2020-03-11 | Discharge: 2020-03-11 | Disposition: A | Discharge status: Home / Self Care | Payer: 59 | Source: Ambulatory Visit | Attending: Adult Health Nurse Practitioner | Admitting: Adult Health Nurse Practitioner

## 2020-03-11 ENCOUNTER — Ambulatory Visit: Payer: 59 | Admitting: Adult Health Nurse Practitioner

## 2020-03-11 ENCOUNTER — Encounter: Payer: Self-pay | Admitting: Adult Health Nurse Practitioner

## 2020-03-11 VITALS — BP 161/96 | HR 99 | Temp 98.0°F | Ht 64.0 in | Wt 217.8 lb

## 2020-03-11 DIAGNOSIS — R300 Dysuria: Secondary | ICD-10-CM | POA: Diagnosis not present

## 2020-03-11 DIAGNOSIS — N898 Other specified noninflammatory disorders of vagina: Secondary | ICD-10-CM

## 2020-03-11 DIAGNOSIS — Z113 Encounter for screening for infections with a predominantly sexual mode of transmission: Secondary | ICD-10-CM | POA: Insufficient documentation

## 2020-03-11 LAB — POCT URINALYSIS DIP (MANUAL ENTRY)
Bilirubin, UA: NEGATIVE
Glucose, UA: NEGATIVE mg/dL
Ketones, POC UA: NEGATIVE mg/dL
Nitrite, UA: NEGATIVE
Spec Grav, UA: 1.025 (ref 1.010–1.025)
Urobilinogen, UA: 0.2 E.U./dL
pH, UA: 6.5 (ref 5.0–8.0)

## 2020-03-11 LAB — POCT WET + KOH PREP
Trich by wet prep: ABSENT
Yeast by KOH: ABSENT
Yeast by wet prep: ABSENT

## 2020-03-11 NOTE — Progress Notes (Signed)
   SUBJECTIVE:  59 y.o. female complains of yellow vaginal discharge for 1 day.  This seems to be an abnormal color for her.  She is concerned she could have an asymptomatic STI.  Denies pain, frequency, bleeding, pain after sex, or malodorous discharge.  The change in color is what brought her to be evaluated.  She would like to be screened for all STIs.   Denies abnormal vaginal bleeding or significant pelvic pain or fever. No UTI symptoms. Denies history of known exposure to STD.  Started PCN last night (after initially noticing color change) for a tooth infection.  No vaginal itching or hx of recurrent yeast infections with antibiotics.   No LMP recorded. Patient is postmenopausal.  OBJECTIVE:  She appears well, afebrile.  GEN: WDWN, NAD, Non-toxic, Alert & Oriented x 3 HEENT: Atraumatic, Normocephalic.  Ears and Nose: No external deformity. EXTR: No clubbing/cyanosis/edema NEURO: Normal gait.  PSYCH: Normally interactive. Conversant. Not depressed or anxious appearing.  Calm demeanor.   Urine dipstick: negative for all components. Microscopic wet-mount exam shows negative for pathogens, normal epithelial cells.'   ASSESSMENT:  Abnormal Vaginal discharge and color  PLAN:  GC and chlamydia DNA  probe sent to lab. Treatment: see orders for additional treatments.  Negative testing thus far.  Will await GC/Chlamydia results.  ROV prn if symptoms persist or worsen.  Elyse Jarvis, NP

## 2020-03-11 NOTE — Patient Instructions (Signed)
° ° ° °  If you have lab work done today you will be contacted with your lab results within the next 2 weeks.  If you have not heard from us then please contact us. The fastest way to get your results is to register for My Chart. ° ° °IF you received an x-ray today, you will receive an invoice from [facility]. Please contact [facility] at [phone] with questions or concerns regarding your invoice.  ° °IF you received labwork today, you will receive an invoice from [facility]. Please contact [facility] at [phone] with questions or concerns regarding your invoice.  ° °Our billing staff will not be able to assist you with questions regarding bills from these companies. ° °You will be contacted with the lab results as soon as they are available. The fastest way to get your results is to activate your My Chart account. Instructions are located on the last page of this paperwork. If you have not heard from us regarding the results in 2 weeks, please contact this office. °  ° ° ° °

## 2020-03-12 LAB — HIV ANTIBODY (ROUTINE TESTING W REFLEX): HIV Screen 4th Generation wRfx: NONREACTIVE

## 2020-03-12 LAB — RPR: RPR Ser Ql: NONREACTIVE

## 2020-03-14 LAB — GC/CHLAMYDIA PROBE AMP (~~LOC~~) NOT AT ARMC
Chlamydia: NEGATIVE
Comment: NEGATIVE
Comment: NORMAL
Neisseria Gonorrhea: NEGATIVE

## 2020-03-16 ENCOUNTER — Telehealth: Payer: Self-pay | Admitting: Adult Health Nurse Practitioner

## 2020-03-16 NOTE — Telephone Encounter (Signed)
What symptoms do you have? DISCHARGE HAS COLOR  , not sure if its because of penicillin, traces of blood   How long have you been sick? Since last ov with [facility].  got worse   Have you been seen for this problem? Yes   If your provider decides to give you a prescription, which pharmacy would you like for it to be sent to?  [id number] - [place], [place] - [address]   Patient informed that this information will be sent to the clinical staff for review and that they should receive a follow up call.  Pt would like for a call back to get results on the Urine test, stated that she is not feeling any better and actually feels worse   Please advise

## 2020-03-16 NOTE — Telephone Encounter (Signed)
Please Advise

## 2020-03-16 NOTE — Telephone Encounter (Signed)
Pt is still feeling very uncomfortable and does not want to go another night without medication. Pt wants her result and a med called in asap.  Stating she had left a message first thing this am as she had a miserable night last nite. pls fu

## 2020-03-17 NOTE — Telephone Encounter (Signed)
Spoke with pt and she stated that she is feeling better. She had bought some OTC to help with the discomfort and she would like to wait a couple of days to see if the medication that she bought will continue to help and if not she will contact the office to see what else can be done.

## 2020-03-24 ENCOUNTER — Telehealth: Payer: 59 | Admitting: Adult Health Nurse Practitioner

## 2020-03-24 ENCOUNTER — Telehealth: Payer: Self-pay

## 2020-03-24 ENCOUNTER — Other Ambulatory Visit: Payer: Self-pay

## 2020-03-24 NOTE — Telephone Encounter (Signed)
Appointment is already scheduled for 03/25/2020

## 2020-03-25 ENCOUNTER — Telehealth (INDEPENDENT_AMBULATORY_CARE_PROVIDER_SITE_OTHER): Payer: 59 | Admitting: Adult Health Nurse Practitioner

## 2020-03-25 ENCOUNTER — Other Ambulatory Visit: Payer: Self-pay

## 2020-03-25 ENCOUNTER — Encounter: Payer: Self-pay | Admitting: Adult Health Nurse Practitioner

## 2020-03-25 DIAGNOSIS — N898 Other specified noninflammatory disorders of vagina: Secondary | ICD-10-CM

## 2020-03-25 NOTE — Patient Instructions (Signed)
° ° ° °  If you have lab work done today you will be contacted with your lab results within the next 2 weeks.  If you have not heard from us then please contact us. The fastest way to get your results is to register for My Chart. ° ° °IF you received an x-ray today, you will receive an invoice from [facility]. Please contact  Radiology at [phone] with questions or concerns regarding your invoice.  ° °IF you received labwork today, you will receive an invoice from [facility]. Please contact [facility] at [phone] with questions or concerns regarding your invoice.  ° °Our billing staff will not be able to assist you with questions regarding bills from these companies. ° °You will be contacted with the lab results as soon as they are available. The fastest way to get your results is to activate your My Chart account. Instructions are located on the last page of this paperwork. If you have not heard from us regarding the results in 2 weeks, please contact this office. °  ° ° ° °

## 2020-03-25 NOTE — Progress Notes (Signed)
Virtual Visit via Video Note  I connected with Taylor Villegas on 03/25/20 at  9:10 AM EDT by a video enabled telemedicine application and verified that I am speaking with the correct person using two identifiers.  Location: Patient: hone Provider: PCP   I discussed the limitations of evaluation and management by telemedicine and the availability of in person appointments. The patient expressed understanding and agreed to proceed.  History of Present Illness: Patient calls for another episode of discolored vaginal discharge.  She also has some irritation around her vulva.  She was last seen 2 weeks ago with negative findings on KOH prep and on urine.  She is not tried anything to the area.  She asked why I could not just call in something for negative findings.   Observations/Objective:  General appearance: alert, well appearing, and in no distress.  Assessment and Plan:  1.  Vaginal Discharge  Follow Up Instructions:   Discussed with the patient that she could leave a sample either today or Monday if she is concerned.  But given that there was nothing on her last wet prep and UA it would be difficult to treat over the phone.  In the interim I suggested hydrocortisone 1% to the affected area.  She verbalized understanding.  She may come in on Monday leave a sample.    I discussed the assessment and treatment plan with the patient. The patient was provided an opportunity to ask questions and all were answered. The patient agreed with the plan and demonstrated an understanding of the instructions.   The patient was advised to call back or seek an in-person evaluation if the symptoms worsen or if the condition fails to improve as anticipated.  I provided 10 minutes of non-face-to-face time during this encounter.   Elyse Jarvis, NP

## 2020-03-28 ENCOUNTER — Other Ambulatory Visit: Payer: Self-pay

## 2020-03-28 ENCOUNTER — Ambulatory Visit (INDEPENDENT_AMBULATORY_CARE_PROVIDER_SITE_OTHER): Payer: 59 | Admitting: Family Medicine

## 2020-03-28 DIAGNOSIS — R82998 Other abnormal findings in urine: Secondary | ICD-10-CM

## 2020-03-28 DIAGNOSIS — N898 Other specified noninflammatory disorders of vagina: Secondary | ICD-10-CM

## 2020-03-28 LAB — POCT WET + KOH PREP
Trich by wet prep: ABSENT
Yeast by KOH: ABSENT
Yeast by wet prep: ABSENT

## 2020-03-28 LAB — POCT URINALYSIS DIP (MANUAL ENTRY)
Bilirubin, UA: NEGATIVE
Glucose, UA: NEGATIVE mg/dL
Ketones, POC UA: NEGATIVE mg/dL
Nitrite, UA: NEGATIVE
Protein Ur, POC: 30 mg/dL — AB
Spec Grav, UA: 1.025 (ref 1.010–1.025)
Urobilinogen, UA: 0.2 E.U./dL
pH, UA: 6.5 (ref 5.0–8.0)

## 2020-03-28 NOTE — Patient Instructions (Signed)
Pt performed wet pre and gave urine, will follow up pcp for results

## 2020-03-30 LAB — URINE CULTURE

## 2020-03-31 ENCOUNTER — Telehealth: Payer: Self-pay | Admitting: *Deleted

## 2020-03-31 NOTE — Telephone Encounter (Signed)
error 

## 2020-04-05 ENCOUNTER — Telehealth: Payer: Self-pay

## 2020-04-05 DIAGNOSIS — N898 Other specified noninflammatory disorders of vagina: Secondary | ICD-10-CM

## 2020-04-05 NOTE — Telephone Encounter (Signed)
Sent referral that pt is requesting for the vaginal discharge and discomfort that she is having per Maralyn Sago NP

## 2020-05-09 NOTE — Telephone Encounter (Signed)
No ation required at this time, closing encounter from inbox 

## 2020-09-23 ENCOUNTER — Ambulatory Visit (INDEPENDENT_AMBULATORY_CARE_PROVIDER_SITE_OTHER): Payer: No Typology Code available for payment source

## 2020-09-23 ENCOUNTER — Ambulatory Visit: Payer: 59 | Admitting: Registered Nurse

## 2020-09-23 ENCOUNTER — Telehealth: Payer: Self-pay | Admitting: General Practice

## 2020-09-23 ENCOUNTER — Encounter: Payer: Self-pay | Admitting: Registered Nurse

## 2020-09-23 ENCOUNTER — Other Ambulatory Visit: Payer: Self-pay

## 2020-09-23 VITALS — BP 154/105 | HR 120 | Temp 98.7°F | Ht 64.0 in | Wt 226.3 lb

## 2020-09-23 DIAGNOSIS — Z1159 Encounter for screening for other viral diseases: Secondary | ICD-10-CM | POA: Diagnosis not present

## 2020-09-23 DIAGNOSIS — Z23 Encounter for immunization: Secondary | ICD-10-CM | POA: Diagnosis not present

## 2020-09-23 DIAGNOSIS — Z6838 Body mass index (BMI) 38.0-38.9, adult: Secondary | ICD-10-CM | POA: Diagnosis not present

## 2020-09-23 DIAGNOSIS — M25572 Pain in left ankle and joints of left foot: Secondary | ICD-10-CM | POA: Diagnosis not present

## 2020-09-23 DIAGNOSIS — M79672 Pain in left foot: Secondary | ICD-10-CM

## 2020-09-23 LAB — POCT GLYCOSYLATED HEMOGLOBIN (HGB A1C): Hemoglobin A1C: 6.2 % — AB (ref 4.0–5.6)

## 2020-09-23 NOTE — Progress Notes (Signed)
If you wouldn't mind calling Taylor Villegas -   No broken bones on x-ray. I will order the ultrasound doppler and she should get a call today or Monday. I will contact her on Monday about her labs.  Thank you!  Rich

## 2020-09-23 NOTE — Telephone Encounter (Signed)
Patient is calling for the topical cream that was recommended for her finger during visit today.

## 2020-09-23 NOTE — Patient Instructions (Signed)
° ° ° °  If you have lab work done today you will be contacted with your lab results within the next 2 weeks.  If you have not heard from us then please contact us. The fastest way to get your results is to register for My Chart. ° ° °IF you received an x-ray today, you will receive an invoice from [facility]. Please contact [facility] at [phone] with questions or concerns regarding your invoice.  ° °IF you received labwork today, you will receive an invoice from [facility]. Please contact [facility] at [phone] with questions or concerns regarding your invoice.  ° °Our billing staff will not be able to assist you with questions regarding bills from these companies. ° °You will be contacted with the lab results as soon as they are available. The fastest way to get your results is to activate your My Chart account. Instructions are located on the last page of this paperwork. If you have not heard from us regarding the results in 2 weeks, please contact this office. °  ° ° ° °

## 2020-09-23 NOTE — Telephone Encounter (Signed)
Patient had an appt this morning. Patient went to pharmacy and no medicaitons where sent in for her please advise CB- [phone] Also patient is request to see if her lab results are back. Please advise

## 2020-09-24 LAB — CBC WITH DIFFERENTIAL
Basophils Absolute: 0.1 10*3/uL (ref 0.0–0.2)
Basos: 1 %
EOS (ABSOLUTE): 0.1 10*3/uL (ref 0.0–0.4)
Eos: 2 %
Hematocrit: 42.4 % (ref 34.0–46.6)
Hemoglobin: 13.6 g/dL (ref 11.1–15.9)
Immature Grans (Abs): 0 10*3/uL (ref 0.0–0.1)
Immature Granulocytes: 0 %
Lymphocytes Absolute: 1.5 10*3/uL (ref 0.7–3.1)
Lymphs: 19 %
MCH: 25.5 pg — ABNORMAL LOW (ref 26.6–33.0)
MCHC: 32.1 g/dL (ref 31.5–35.7)
MCV: 80 fL (ref 79–97)
Monocytes Absolute: 0.7 10*3/uL (ref 0.1–0.9)
Monocytes: 9 %
Neutrophils Absolute: 5.5 10*3/uL (ref 1.4–7.0)
Neutrophils: 69 %
RBC: 5.33 x10E6/uL — ABNORMAL HIGH (ref 3.77–5.28)
RDW: 15.5 % — ABNORMAL HIGH (ref 11.7–15.4)
WBC: 7.9 10*3/uL (ref 3.4–10.8)

## 2020-09-24 LAB — LIPID PANEL
Chol/HDL Ratio: 3.1 ratio (ref 0.0–4.4)
Cholesterol, Total: 164 mg/dL (ref 100–199)
HDL: 53 mg/dL (ref >39)
LDL Chol Calc (NIH): 97 mg/dL (ref 0–99)
Triglycerides: 72 mg/dL (ref 0–149)
VLDL Cholesterol Cal: 14 mg/dL (ref 5–40)

## 2020-09-24 LAB — COMPREHENSIVE METABOLIC PANEL
ALT: 32 IU/L (ref 0–32)
AST: 18 IU/L (ref 0–40)
Albumin/Globulin Ratio: 1.5 (ref 1.2–2.2)
Albumin: 4.4 g/dL (ref 3.8–4.9)
Alkaline Phosphatase: 97 IU/L (ref 44–121)
BUN/Creatinine Ratio: 13 (ref 9–23)
BUN: 11 mg/dL (ref 6–24)
Bilirubin Total: <0.2 mg/dL (ref 0.0–1.2)
CO2: 22 mmol/L (ref 20–29)
Calcium: 9.7 mg/dL (ref 8.7–10.2)
Chloride: 106 mmol/L (ref 96–106)
Creatinine, Ser: 0.83 mg/dL (ref 0.57–1.00)
GFR calc Af Amer: 90 mL/min/{1.73_m2} (ref >59)
GFR calc non Af Amer: 78 mL/min/{1.73_m2} (ref >59)
Globulin, Total: 3 g/dL (ref 1.5–4.5)
Glucose: 113 mg/dL — ABNORMAL HIGH (ref 65–99)
Potassium: 4.3 mmol/L (ref 3.5–5.2)
Sodium: 144 mmol/L (ref 134–144)
Total Protein: 7.4 g/dL (ref 6.0–8.5)

## 2020-09-24 LAB — TSH: TSH: 0.752 u[IU]/mL (ref 0.450–4.500)

## 2020-09-26 ENCOUNTER — Telehealth: Payer: Self-pay | Admitting: General Practice

## 2020-09-26 NOTE — Telephone Encounter (Signed)
Pt is wanting a referral regarding her arthrodesis. Pt also would like a call when NP Janeece Agee looks over pts labs. Please advise

## 2020-09-26 NOTE — Telephone Encounter (Signed)
Requesting referral for specialty for arthrodesis

## 2020-09-29 LAB — HCV AB W REFLEX TO QUANT PCR: HCV Ab: <0.1 s/co ratio (ref 0.0–0.9)

## 2020-09-29 LAB — HCV INTERPRETATION

## 2020-09-29 NOTE — Telephone Encounter (Signed)
Ok to call patient and let her know labs show no acute concerns.  Thank you  Jari Sportsman, NP

## 2020-09-29 NOTE — Telephone Encounter (Signed)
Pt wants a referral to see someone about her arthritis and she also wants the [place] ordered for her foot as she is still painful.

## 2020-09-29 NOTE — Telephone Encounter (Signed)
I believe I just talked about Voltaren gel OTC  Thank you  Jari Sportsman, NP

## 2020-10-07 NOTE — Telephone Encounter (Signed)
Pt what's a Referral to see a Dr for Arthritis a SIP please Advice

## 2020-10-10 ENCOUNTER — Telehealth: Payer: Self-pay | Admitting: General Practice

## 2020-10-10 NOTE — Telephone Encounter (Signed)
Pt called need a referral for her finger SIP she states no one call with the right Referral  please Advice

## 2020-10-10 NOTE — Telephone Encounter (Signed)
Needs an appointment in order for [place] to refer her

## 2020-10-10 NOTE — Telephone Encounter (Signed)
Pt called in - she is wanting to get a referral for arthritis - she would like to go to [facility].   She also would like a podiatry referral to [facility]   Thank you

## 2020-10-10 NOTE — Telephone Encounter (Signed)
Schedule pt for appt on 10/19/20

## 2020-10-10 NOTE — Telephone Encounter (Signed)
Request sent to provider waiting on information

## 2020-10-13 ENCOUNTER — Other Ambulatory Visit: Payer: Self-pay | Admitting: Registered Nurse

## 2020-10-13 ENCOUNTER — Telehealth: Payer: Self-pay | Admitting: Registered Nurse

## 2020-10-13 DIAGNOSIS — M19042 Primary osteoarthritis, left hand: Secondary | ICD-10-CM

## 2020-10-13 NOTE — Telephone Encounter (Signed)
Patient is calling regarding recent referral for the arthritis in her finger . Pt states spoke  with Provider , patient states she needs a referral to [facility] phone number [phone]/ The other referral needed for LFT foot  should have been sent to [facility] phone # for that office is [phone] Patient already has appt at [facility] on 10/25/2020 and would like referral request sent to them and x-ray sent over    Pleas advise

## 2020-10-14 ENCOUNTER — Other Ambulatory Visit: Payer: Self-pay | Admitting: Registered Nurse

## 2020-10-14 DIAGNOSIS — M19041 Primary osteoarthritis, right hand: Secondary | ICD-10-CM

## 2020-10-14 DIAGNOSIS — M19042 Primary osteoarthritis, left hand: Secondary | ICD-10-CM

## 2020-10-14 DIAGNOSIS — M1990 Unspecified osteoarthritis, unspecified site: Secondary | ICD-10-CM

## 2020-10-14 NOTE — Telephone Encounter (Signed)
Please advise this is not what is in her notes, nor what was ordered.

## 2020-10-18 NOTE — Progress Notes (Signed)
Established Patient Office Visit  Subjective:  Patient ID: Taylor Villegas, female    DOB: 10-19-1961  Age: 59 y.o. MRN: [id number]  CC:  Chief Complaint  Patient presents with  . Foot Swelling    Pt has been experiencing Lt feet pain and swelling for the past 3wks.    HPI Taylor Villegas presents for foot swelling  Left foot pain and swelling. Apparent sudden onset 3 weeks ago. No injury noted. Pain is worst on lateral edge of foot. No radiation into ankle or up leg. No numbness, tingling, or paresthesia symptoms.   Also notes bilateral hand pain and stiffness. Believes this to be arthritis. No heberden's nodes. Does frequently use hands at work. Notes pain is relatively constant and doesn't depend much on time of day. Interested in discussing this with specialists.  Otherwise feeling well.   Past Medical History:  Diagnosis Date  . UTI (urinary tract infection) 02/17/2020    History reviewed. No pertinent surgical history.  Family History  Problem Relation Age of Onset  . Hypertension Mother   . Diabetes Father     Social History   Socioeconomic History  . Marital status: Married    Spouse name: Not on file  . Number of children: Not on file  . Years of education: Not on file  . Highest education level: Not on file  Occupational History  . Not on file  Tobacco Use  . Smoking status: Never Smoker  . Smokeless tobacco: Never Used  Substance and Sexual Activity  . Alcohol use: No    Alcohol/week: 0.0 standard drinks  . Drug use: No  . Sexual activity: Not on file  Other Topics Concern  . Not on file  Social History Narrative  . Not on file   Social Determinants of Health   Financial Resource Strain:   . Difficulty of Paying Living Expenses: Not on file  Food Insecurity:   . Worried About Programme researcher, broadcasting/film/video in the Last Year: Not on file  . Ran Out of Food in the Last Year: Not on file  Transportation Needs:   . Lack of Transportation  (Medical): Not on file  . Lack of Transportation (Non-Medical): Not on file  Physical Activity:   . Days of Exercise per Week: Not on file  . Minutes of Exercise per Session: Not on file  Stress:   . Feeling of Stress : Not on file  Social Connections:   . Frequency of Communication with Friends and Family: Not on file  . Frequency of Social Gatherings with Friends and Family: Not on file  . Attends Religious Services: Not on file  . Active Member of Clubs or Organizations: Not on file  . Attends Banker Meetings: Not on file  . Marital Status: Not on file  Intimate Partner Violence:   . Fear of Current or Ex-Partner: Not on file  . Emotionally Abused: Not on file  . Physically Abused: Not on file  . Sexually Abused: Not on file    No outpatient medications prior to visit.   No facility-administered medications prior to visit.    No Known Allergies  ROS Review of Systems Per hpi     Objective:    Physical Exam Vitals and nursing note reviewed.  Constitutional:      General: She is not in acute distress.    Appearance: Normal appearance. She is obese. She is not ill-appearing, toxic-appearing or diaphoretic.  Cardiovascular:  Rate and Rhythm: Normal rate and regular rhythm.  Pulmonary:     Effort: Pulmonary effort is normal. No respiratory distress.  Musculoskeletal:        General: Swelling (mild L foot) and tenderness (lateral edge of L foot) present. No deformity. Normal range of motion.     Right lower leg: No edema.     Left lower leg: No edema.     Comments: bilat hands wnl on exam  Skin:    General: Skin is warm and dry.     Capillary Refill: Capillary refill takes less than 2 seconds.     Findings: No bruising.  Neurological:     General: No focal deficit present.     Mental Status: She is alert and oriented to person, place, and time. Mental status is at baseline.  Psychiatric:        Attention and Perception: Attention and perception  normal.        Mood and Affect: Mood is anxious.        Speech: She is communicative. Speech is rapid and pressured and tangential. Speech is not delayed or slurred.        Behavior: Behavior normal. Behavior is cooperative.        Thought Content: Thought content normal.        Cognition and Memory: Cognition and memory normal.        Judgment: Judgment normal.     BP (!) 154/105 (BP Location: Right Arm, Patient Position: Sitting, Cuff Size: Large)   Pulse (!) 120   Temp 98.7 F (37.1 C) (Temporal)   Ht 5\' 4"  (1.626 m)   Wt 226 lb 4.8 oz (102.6 kg)   SpO2 96%   BMI 38.84 kg/m  Wt Readings from Last 3 Encounters:  09/23/20 226 lb 4.8 oz (102.6 kg)  03/11/20 217 lb 12.8 oz (98.8 kg)  02/17/20 215 lb 6.4 oz (97.7 kg)     Health Maintenance Due  Topic Date Due  . COVID-19 Vaccine (1) Never done    There are no preventive care reminders to display for this patient.  Lab Results  Component Value Date   TSH 0.752 09/23/2020   Lab Results  Component Value Date   WBC 7.9 09/23/2020   HGB 13.6 09/23/2020   HCT 42.4 09/23/2020   MCV 80 09/23/2020   PLT 389 06/11/2016   Lab Results  Component Value Date   NA 144 09/23/2020   K 4.3 09/23/2020   CO2 22 09/23/2020   GLUCOSE 113 (H) 09/23/2020   BUN 11 09/23/2020   CREATININE 0.83 09/23/2020   BILITOT <0.2 09/23/2020   ALKPHOS 97 09/23/2020   AST 18 09/23/2020   ALT 32 09/23/2020   PROT 7.4 09/23/2020   ALBUMIN 4.4 09/23/2020   CALCIUM 9.7 09/23/2020   Lab Results  Component Value Date   CHOL 164 09/23/2020   Lab Results  Component Value Date   HDL 53 09/23/2020   Lab Results  Component Value Date   LDLCALC 97 09/23/2020   Lab Results  Component Value Date   TRIG 72 09/23/2020   Lab Results  Component Value Date   CHOLHDL 3.1 09/23/2020   Lab Results  Component Value Date   HGBA1C 6.2 (A) 09/23/2020      Assessment & Plan:   Problem List Items Addressed This Visit    None    Visit Diagnoses     Screening for viral disease    -  Primary  Relevant Orders   Hepatitis C antibody (reflex, frozen specimen) (Completed)   BMI 38.0-38.9,adult       Relevant Orders   POCT glycosylated hemoglobin (Hb A1C) (Completed)   Comprehensive metabolic panel (Completed)   Lipid panel (Completed)   CBC With Differential (Completed)   TSH (Completed)   Need for diphtheria-tetanus-pertussis (Tdap) vaccine       Relevant Orders   Tdap vaccine greater than or equal to 7yo IM (Completed)   Acute pain of left foot       Relevant Orders   DG Foot Complete Left (Completed)   Acute left ankle pain       Relevant Orders   DG Ankle Complete Left (Completed)      No orders of the defined types were placed in this encounter.   Follow-up: No follow-ups on file.   PLAN  Pt tangential to the extent of being a poor historian.   Dg L foot shows no acute bony abnormalities. Suggest RICE and OTCs. Can discuss further with ortho if desired.  Will refer to rheum and hand specialist for work up of arthritis in hands  Patient encouraged to call clinic with any questions, comments, or concerns.  I spent 28 minutes with this patient, more than 50% of which was spent counseling and/or educating.  Janeece Agee, NP

## 2020-10-19 ENCOUNTER — Other Ambulatory Visit: Payer: Self-pay

## 2020-10-19 ENCOUNTER — Ambulatory Visit: Payer: No Typology Code available for payment source | Admitting: Registered Nurse

## 2020-10-25 ENCOUNTER — Other Ambulatory Visit: Payer: Self-pay | Admitting: Registered Nurse

## 2020-10-25 DIAGNOSIS — M1990 Unspecified osteoarthritis, unspecified site: Secondary | ICD-10-CM

## 2020-10-25 DIAGNOSIS — M79641 Pain in right hand: Secondary | ICD-10-CM

## 2020-11-22 ENCOUNTER — Ambulatory Visit: Payer: No Typology Code available for payment source | Admitting: Internal Medicine

## 2020-12-20 ENCOUNTER — Ambulatory Visit: Payer: No Typology Code available for payment source | Admitting: Internal Medicine

## 2021-01-24 ENCOUNTER — Ambulatory Visit: Payer: No Typology Code available for payment source | Admitting: Internal Medicine

## 2021-02-01 IMAGING — DX DG ANKLE COMPLETE 3+V*L*
3 series · 3 of 3 positions shown · non-contrast
Comparison: None.

CLINICAL DATA: Acute ankle pain

EXAM:
LEFT ANKLE COMPLETE - 3+ VIEW

[ankle obl]
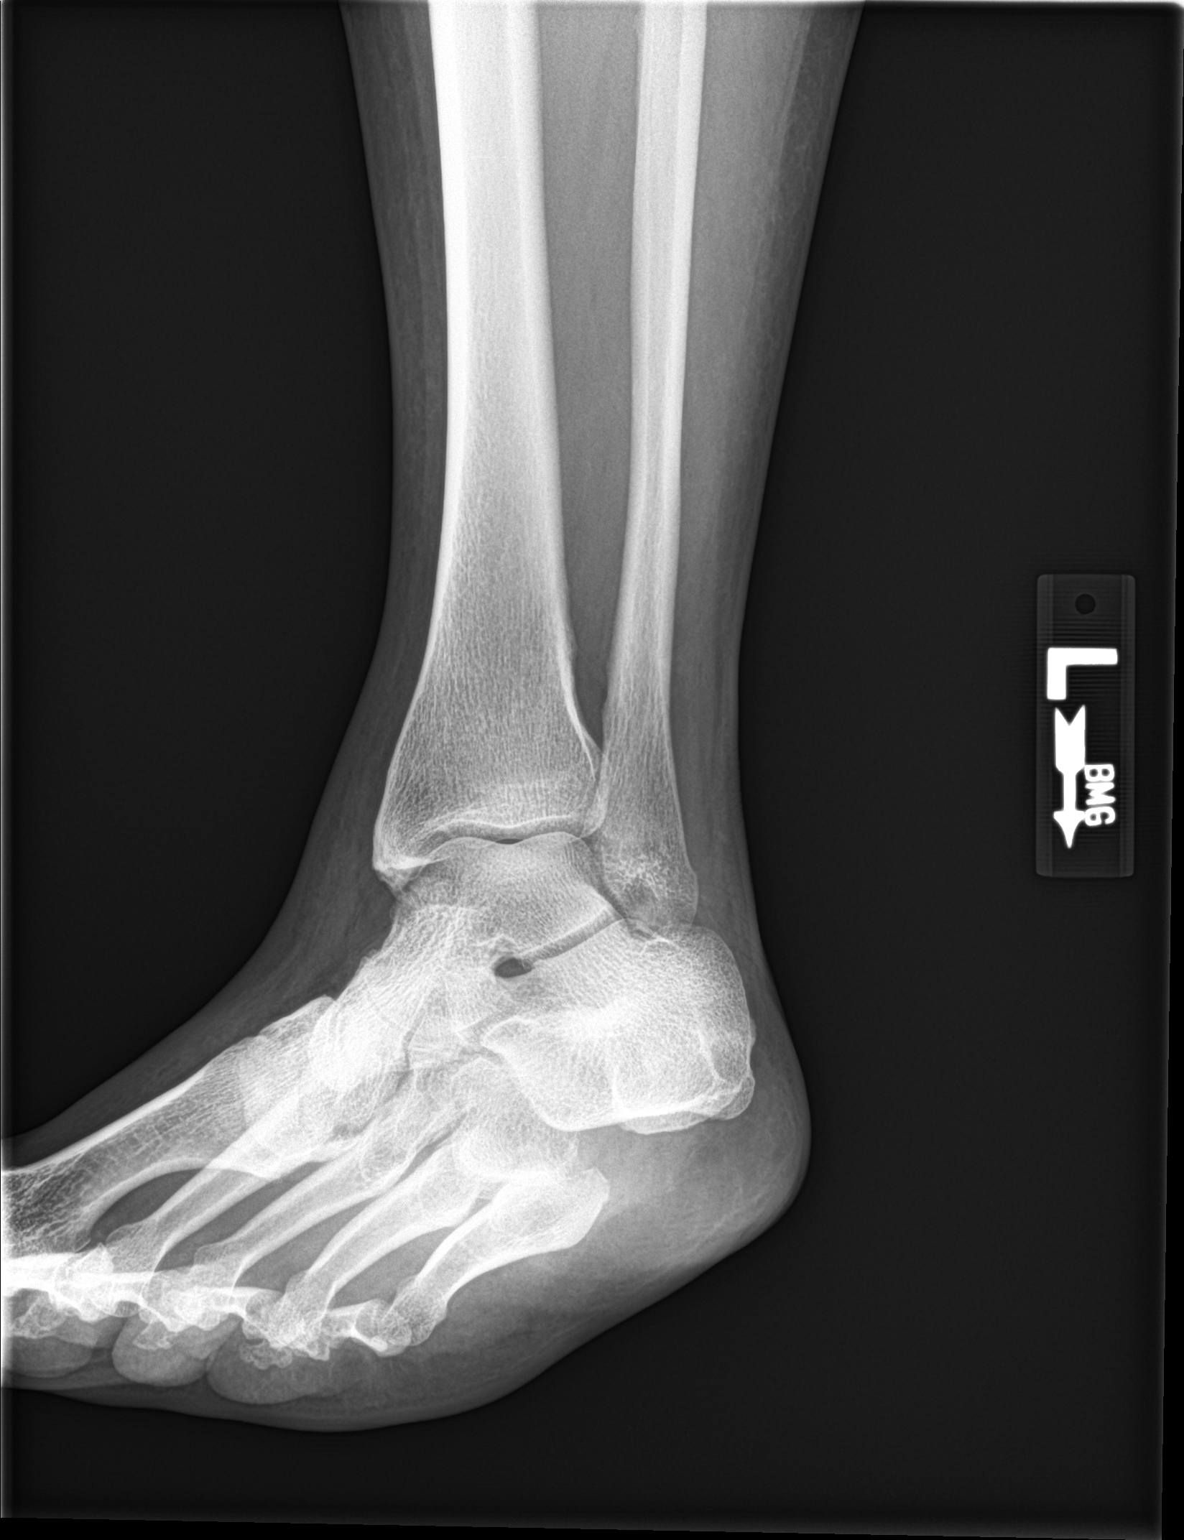

[ankle ap]
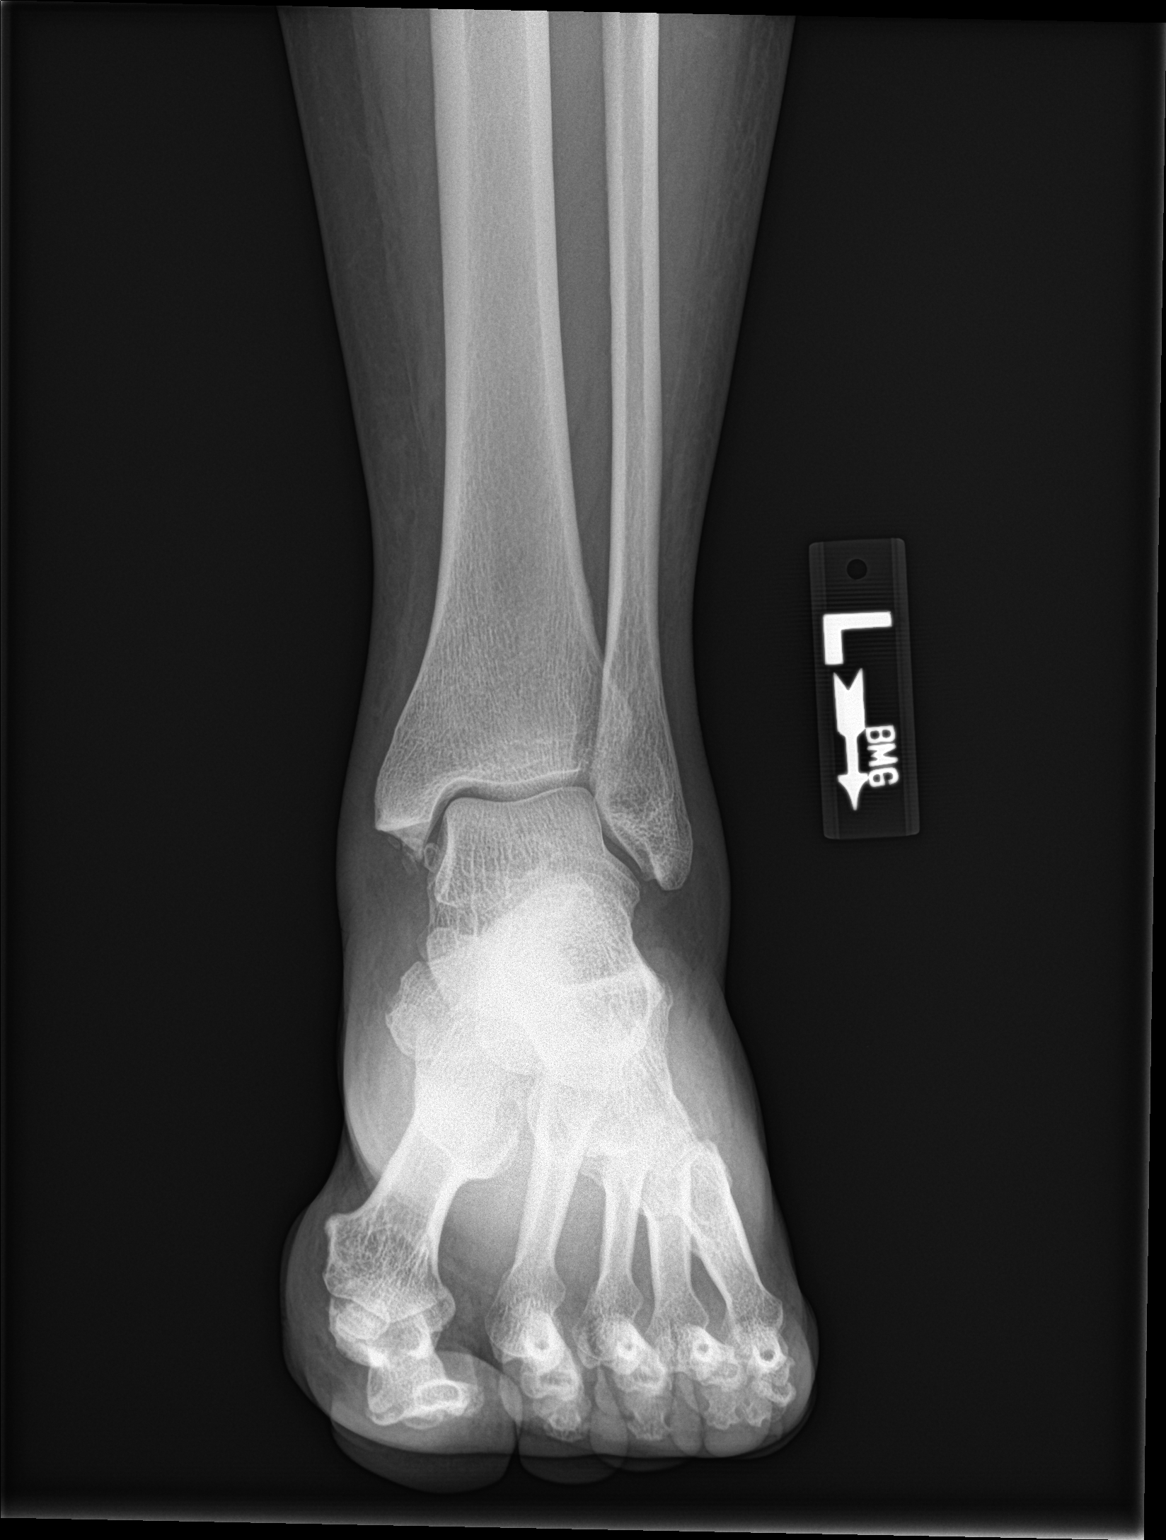

[ankle lat]
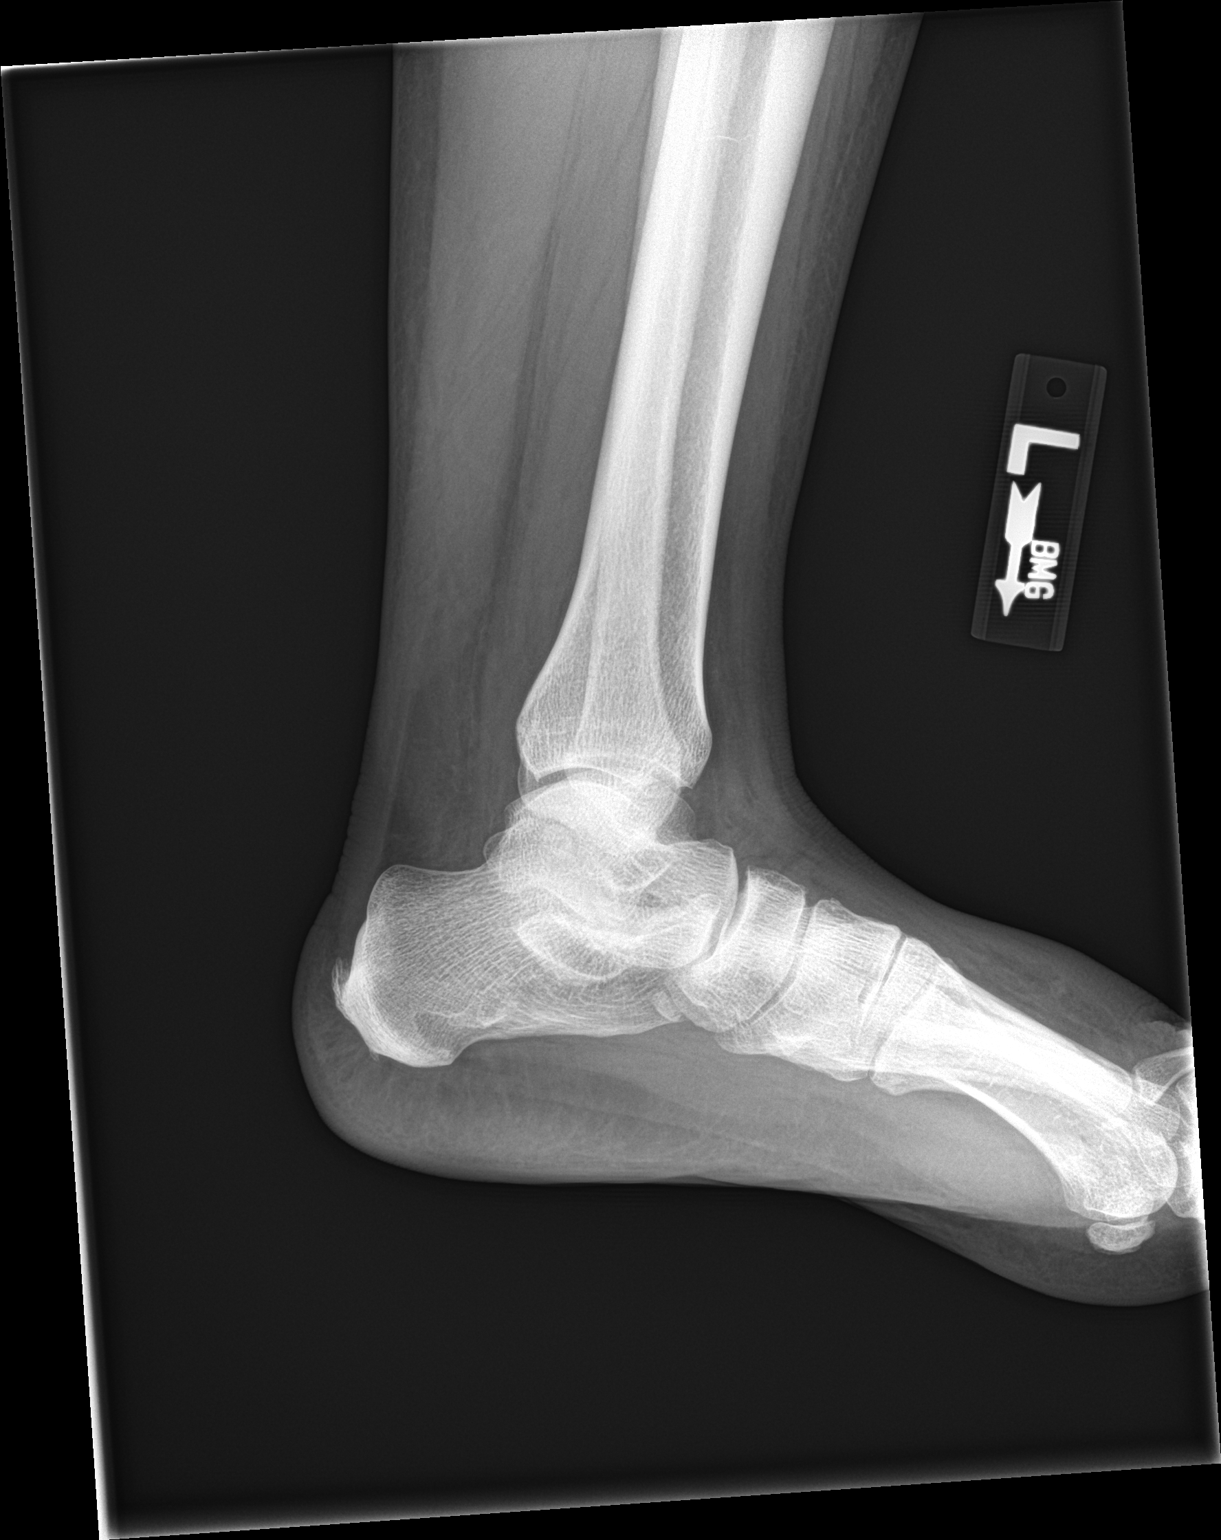

[3 of 3 positions shown; findings below may reference images not displayed]

FINDINGS: Soft tissue swelling about the ankle and dorsal foot. No acute
fracture, subluxation, or erosion. Chronic ossicles at the medial
malleolus.
IMPRESSION: Soft tissue swelling without acute osseous finding.

## 2021-02-28 ENCOUNTER — Ambulatory Visit: Payer: No Typology Code available for payment source | Admitting: Internal Medicine

## 2021-04-03 ENCOUNTER — Ambulatory Visit: Payer: No Typology Code available for payment source | Admitting: Internal Medicine

## 2021-05-09 ENCOUNTER — Ambulatory Visit: Payer: No Typology Code available for payment source | Admitting: Internal Medicine

## 2022-01-18 ENCOUNTER — Ambulatory Visit: Payer: No Typology Code available for payment source | Admitting: Internal Medicine

## 2022-02-09 ENCOUNTER — Telehealth: Payer: Self-pay | Admitting: Internal Medicine

## 2022-02-09 NOTE — Telephone Encounter (Signed)
Called pt lmom for pt to call the office back to get referral appt scheduled w/Dr. Lonzo Cloud.

## 2022-03-09 ENCOUNTER — Other Ambulatory Visit: Payer: Self-pay | Admitting: Endocrinology

## 2022-03-09 DIAGNOSIS — Z1231 Encounter for screening mammogram for malignant neoplasm of breast: Secondary | ICD-10-CM
